# Patient Record
Sex: Female | Born: 2000 | Race: Asian | Hispanic: No | Marital: Single | Smoking: Never smoker
Health system: Southern US, Community
[De-identification: ages and names within clinical notes are randomized; demographics above are authoritative.]

---
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
## 2021-11-03 ENCOUNTER — Ambulatory Visit (HOSPITAL_COMMUNITY)
Admission: EM | Admit: 2021-11-03 | Discharge: 2021-11-03 | Disposition: A | Discharge status: Home / Self Care | Payer: PRIVATE HEALTH INSURANCE | Attending: Emergency Medicine | Admitting: Emergency Medicine

## 2021-11-03 ENCOUNTER — Other Ambulatory Visit: Payer: Self-pay

## 2021-11-03 ENCOUNTER — Encounter (HOSPITAL_COMMUNITY): Payer: Self-pay | Admitting: Emergency Medicine

## 2021-11-03 DIAGNOSIS — R220 Localized swelling, mass and lump, head: Secondary | ICD-10-CM | POA: Diagnosis not present

## 2021-11-03 DIAGNOSIS — L03211 Cellulitis of face: Secondary | ICD-10-CM

## 2021-11-03 DIAGNOSIS — T7840XA Allergy, unspecified, initial encounter: Secondary | ICD-10-CM

## 2021-11-03 MED ORDER — DIPHENHYDRAMINE HCL 50 MG/ML IJ SOLN
INTRAMUSCULAR | Status: AC
  Filled 2021-11-03: qty 1

## 2021-11-03 MED ORDER — METHYLPREDNISOLONE SODIUM SUCC 125 MG IJ SOLR
125.0000 mg | Freq: Once | INTRAMUSCULAR | Status: AC
  Administered 2021-11-03: 125 mg via INTRAMUSCULAR

## 2021-11-03 MED ORDER — METHYLPREDNISOLONE SODIUM SUCC 125 MG IJ SOLR
INTRAMUSCULAR | Status: AC
  Filled 2021-11-03: qty 2

## 2021-11-03 MED ORDER — DIPHENHYDRAMINE HCL 50 MG/ML IJ SOLN
25.0000 mg | Freq: Once | INTRAMUSCULAR | Status: AC
  Administered 2021-11-03: 25 mg via INTRAMUSCULAR

## 2021-11-03 NOTE — ED Provider Notes (Signed)
[facility]    CSN: [id number] Arrival date & time: 11/03/21  7673      History   Chief Complaint Chief Complaint  Patient presents with   Facial Swelling   Allergic Reaction    HPI Mikayla Mccall is a 20 y.o. female.   Pt had a chemical lash lift tent to upper and lower eyelids on mon 11/28. Pt began to have swelling, pain to eye area, thick drainage approx a few hours post treatment. Pt began to use cool compresses. Began to have a large amount of edema last night and today with erythema noted to surrounding tissue of bil eyes.  Pt is also beginning to have pain and pressure with vision change today.    History reviewed. No pertinent past medical history.  There are no problems to display for this patient.   History reviewed. No pertinent surgical history.  OB History   No obstetric history on file.      Home Medications    Prior to Admission medications   Medication Sig Start Date End Date Taking? Authorizing Provider  cetirizine (ZYRTEC) 10 MG tablet Take 1 tablet (10 mg total) by mouth daily. 12/27/19   Georgetta Haber, NP  mupirocin cream (BACTROBAN) 2 % Apply 1 application topically 2 (two) times daily. 12/27/19   Georgetta Haber, NP  predniSONE (STERAPRED UNI-PAK 21 TAB) 10 MG (21) TBPK tablet Take by mouth daily. Per box instruction 12/27/19   Georgetta Haber, NP    Family History Family History  Problem Relation Age of Onset   Healthy Father     Social History     Allergies   Patient has no known allergies.   Review of Systems Review of Systems  Constitutional:  Negative for fever.  HENT:  Positive for facial swelling.   Eyes:  Positive for photophobia, pain, discharge, redness, itching and visual disturbance.  Respiratory: Negative.  Negative for shortness of breath.   Cardiovascular: Negative.   Gastrointestinal: Negative.   Genitourinary: Negative.   Musculoskeletal: Negative.   Skin:        Large amount of swelling around bil  eyes with redness   Neurological:  Positive for headaches.    Physical Exam Triage Vital Signs ED Triage Vitals  Enc Vitals Group     BP 11/03/21 0920 117/82     Pulse Rate 11/03/21 0920 89     Resp 11/03/21 0920 16     Temp 11/03/21 0920 98.3 F (36.8 C)     Temp Source 11/03/21 0920 Oral     SpO2 11/03/21 0920 99 %     Weight --      Height --      Head Circumference --      Peak Flow --      Pain Score 11/03/21 0918 0     Pain Loc --      Pain Edu? --      Excl. in GC? --    No data found.  Updated Vital Signs BP 117/82 (BP Location: Right Arm)   Pulse 89   Temp 98.3 F (36.8 C) (Oral)   Resp 16   LMP 10/19/2021   SpO2 99%   Visual Acuity Right Eye Distance:   Left Eye Distance:   Bilateral Distance:    Right Eye Near:   Left Eye Near:    Bilateral Near:     Physical Exam Constitutional:      General: She is in acute distress.  HENT:     Right Ear: Tympanic membrane normal.     Left Ear: Tympanic membrane normal.     Nose: Nose normal.     Mouth/Throat:     Mouth: Mucous membranes are moist.  Eyes:     General: Visual field deficit present.        Right eye: Discharge present.        Left eye: Discharge present.    Extraocular Movements:     Right eye: Abnormal extraocular motion present.     Left eye: Abnormal extraocular motion present.     Conjunctiva/sclera:     Right eye: Exudate present.     Left eye: Exudate present.      Comments: Large amount of edema and erythema noted to bil conjunctiva and surrounding tissue pain on palpation. Photophobia  to lateral side of bil cornea .   Neurological:     Mental Status: She is alert.     UC Treatments / Results  Labs (all labs ordered are listed, but only abnormal results are displayed) Labs Reviewed - No data to display  EKG   Radiology No results found.  Procedures Procedures (including critical care time)  Medications Ordered in UC Medications  methylPREDNISolone sodium  succinate (SOLU-MEDROL) 125 mg/2 mL injection 125 mg (125 mg Intramuscular Given 11/03/21 1047)  diphenhydrAMINE (BENADRYL) injection 25 mg (25 mg Intramuscular Given 11/03/21 1047)    Initial Impression / Assessment and Plan / UC Course  I have reviewed the triage vital signs and the nursing notes.  Pertinent labs & imaging results that were available during my care of the patient were reviewed by me and considered in my medical decision making (see chart for details).     Pt was not able to tolerate a vision acuity c/o of it being blurry not able to see it well.  Mani PA seen pt with me  Reviewed previous chart in Jan pt had a similar contact derm reaction to brows.  Spoke to [employer] at [employer] and she was able to see pt at 1245 today. Educated pt and she has a friend and driver with pt. Both understand the plan of care. If pt has increase pain or shortness of breath she is to go to ER  Discussed the concerns for a chemical burn vs allergic reaction  Final Clinical Impressions(s) / UC Diagnoses   Final diagnoses:  Facial swelling  Allergic reaction, initial encounter  Facial cellulitis     Discharge Instructions      I have spoke to Doc Lyles office and scheduled you an appoint to be seen today at 1245. Bring any insurance information with you.  If you have any increase in pain or blurred vision before this time go to [place]  I am concerned of a chemical burn or cellulitis to the area.  Do not drive after given the medication it can make you sleepy.       ED Prescriptions   None    PDMP not reviewed this encounter.   Coralyn Mark, NP 11/03/21 1056

## 2021-11-03 NOTE — Discharge Instructions (Addendum)
I have spoke to Mikayla Mccall office and scheduled you an appoint to be seen today at 1245. Bring any insurance information with you.  If you have any increase in pain or blurred vision before this time go to   I am concerned of a chemical burn or cellulitis to the area.  Do not drive after given the medication it can make you sleepy.

## 2021-11-03 NOTE — ED Triage Notes (Signed)
Pt reports on Monday had eye lash lift and tent done. Yesterday started having swelling around eyes, drainage.

## 2022-03-13 ENCOUNTER — Ambulatory Visit (HOSPITAL_COMMUNITY)
Admission: EM | Admit: 2022-03-13 | Discharge: 2022-03-13 | Disposition: A | Discharge status: Home / Self Care | Payer: Medicaid Other | Attending: Nurse Practitioner | Admitting: Nurse Practitioner

## 2022-03-13 ENCOUNTER — Encounter (HOSPITAL_COMMUNITY): Payer: Self-pay

## 2022-03-13 DIAGNOSIS — T7840XA Allergy, unspecified, initial encounter: Secondary | ICD-10-CM | POA: Diagnosis not present

## 2022-03-13 DIAGNOSIS — M7989 Other specified soft tissue disorders: Secondary | ICD-10-CM | POA: Diagnosis not present

## 2022-03-13 MED ORDER — METHYLPREDNISOLONE SODIUM SUCC 125 MG IJ SOLR
80.0000 mg | Freq: Once | INTRAMUSCULAR | Status: AC
  Administered 2022-03-13: 80 mg via INTRAMUSCULAR

## 2022-03-13 MED ORDER — DIPHENHYDRAMINE HCL 25 MG PO TABS: 25.0000 mg | ORAL_TABLET | Freq: Every evening | ORAL | 0 refills | Status: AC | PRN

## 2022-03-13 MED ORDER — PREDNISONE 10 MG (21) PO TBPK: ORAL_TABLET | ORAL | 0 refills | Status: AC

## 2022-03-13 MED ORDER — DIPHENHYDRAMINE HCL 25 MG PO CAPS
25.0000 mg | ORAL_CAPSULE | Freq: Once | ORAL | Status: AC
  Administered 2022-03-13: 25 mg via ORAL

## 2022-03-13 MED ORDER — CETIRIZINE HCL 10 MG PO TABS: 10.0000 mg | ORAL_TABLET | Freq: Every day | ORAL | 0 refills | Status: DC

## 2022-03-13 MED ORDER — METHYLPREDNISOLONE SODIUM SUCC 125 MG IJ SOLR
INTRAMUSCULAR | Status: AC
  Filled 2022-03-13: qty 2

## 2022-03-13 MED ORDER — DIPHENHYDRAMINE HCL 25 MG PO CAPS
ORAL_CAPSULE | ORAL | Status: AC
  Filled 2022-03-13: qty 1

## 2022-03-13 NOTE — ED Triage Notes (Signed)
Pt presents with allergic reaction on left hand to henna tattoo X 3 days. ?

## 2022-03-13 NOTE — ED Provider Notes (Signed)
?[facility] ? ? ? ?CSN: [id number] ?Arrival date & time: 03/13/22  1308 ? ? ?  ? ?History   ?Chief Complaint ?Chief Complaint  ?Patient presents with  ? Allergic Reaction  ? ? ?HPI ?Mikayla Mccall is a 21 y.o. female.  ? ?Patient presents with mother.  Reports receiving a henna tattoo to her left hand by a friend a few days ago and noticed itching and burning since then to the area where the tattoo was.  She reports the left hand is swollen, painful, and itchy.  There are blisters on her left hand and have drained just a little bit.  She also has some itchy blisters to her face and right side of her neck.  She denies any fevers, nausea/vomiting, throat or tongue swelling or itching, and shortness of breath.  She is able to move her left hand with full range of motion and denies any numbness or tingling in her fingertips.  She has tried hydrocortisone cream over-the-counter without relief of symptoms. ? ? ?History reviewed. No pertinent past medical history. ? ?There are no problems to display for this patient. ? ? ?History reviewed. No pertinent surgical history. ? ?OB History   ?No obstetric history on file. ?  ? ? ? ?Home Medications   ? ?Prior to Admission medications   ?Medication Sig Start Date End Date Taking? Authorizing Provider  ?diphenhydrAMINE (BENADRYL) 25 MG tablet Take 1 tablet (25 mg total) by mouth at bedtime as needed for itching. 03/13/22  Yes Valentino Nose, NP  ?predniSONE (STERAPRED UNI-PAK 21 TAB) 10 MG (21) TBPK tablet Take 6 tablets (60 mg total) by mouth daily for 1 day, THEN 5 tablets (50 mg total) daily for 1 day, THEN 4 tablets (40 mg total) daily for 1 day, THEN 3 tablets (30 mg total) daily for 1 day, THEN 2 tablets (20 mg total) daily for 1 day, THEN 1 tablet (10 mg total) daily for 1 day. 03/13/22 03/19/22 Yes Valentino Nose, NP  ?cetirizine (ZYRTEC) 10 MG tablet Take 1 tablet (10 mg total) by mouth daily. 03/13/22   Valentino Nose, NP  ?mupirocin cream (BACTROBAN) 2 %  Apply 1 application topically 2 (two) times daily. 12/27/19   Georgetta Haber, NP  ? ? ?Family History ?Family History  ?Problem Relation Age of Onset  ? Healthy Father   ? ? ?Social History ?Social History  ? ?Tobacco Use  ? Smoking status: Never  ? Smokeless tobacco: Never  ? ? ? ?Allergies   ?Patient has no known allergies. ? ? ?Review of Systems ?Review of Systems ?Per HPI ? ?Physical Exam ?Triage Vital Signs ?ED Triage Vitals  ?Enc Vitals Group  ?   BP 03/13/22 1329 108/75  ?   Pulse Rate 03/13/22 1329 77  ?   Resp 03/13/22 1329 18  ?   Temp 03/13/22 1329 98.7 ?F (37.1 ?C)  ?   Temp Source 03/13/22 1329 Oral  ?   SpO2 03/13/22 1329 100 %  ?   Weight --   ?   Height --   ?   Head Circumference --   ?   Peak Flow --   ?   Pain Score 03/13/22 1332 3  ?   Pain Loc --   ?   Pain Edu? --   ?   Excl. in GC? --   ? ?No data found. ? ?Updated Vital Signs ?BP 108/75 (BP Location: Left Arm)   Pulse 77  Temp 98.7 ?F (37.1 ?C) (Oral)   Resp 18   LMP 03/08/2022   SpO2 100%  ? ?Visual Acuity ?Right Eye Distance:   ?Left Eye Distance:   ?Bilateral Distance:   ? ?Right Eye Near:   ?Left Eye Near:    ?Bilateral Near:    ? ?Physical Exam ?Vitals and nursing note reviewed.  ?Constitutional:   ?   General: She is not in acute distress. ?   Appearance: Normal appearance. She is not toxic-appearing.  ?HENT:  ?   Mouth/Throat:  ?   Mouth: Mucous membranes are moist.  ?   Pharynx: Oropharynx is clear. No oropharyngeal exudate.  ?Musculoskeletal:  ?   Left hand: Swelling present. Normal range of motion. Normal strength. Normal capillary refill. Normal pulse.  ?   Comments: Left dorsal aspect of hand is slightly erythematous and swollen surrounding the vesicles  ?Skin: ?   General: Skin is warm and dry.  ?   Capillary Refill: Capillary refill takes less than 2 seconds.  ?   Findings: Rash present. Rash is urticarial and vesicular.  ?   Comments: Vesicular rash to dorsal aspect of left hand including fourth and fifth fingers.  There  is no blistering medial to the wrist.  There are a couple of blisters on the right side of her face and on her neck.  ?Neurological:  ?   Mental Status: She is alert and oriented to person, place, and time.  ?Psychiatric:     ?   Behavior: Behavior is cooperative.  ? ? ? ?UC Treatments / Results  ?Labs ?(all labs ordered are listed, but only abnormal results are displayed) ?Labs Reviewed - No data to display ? ?EKG ? ? ?Radiology ?No results found. ? ?Procedures ?Procedures (including critical care time) ? ?Medications Ordered in UC ?Medications  ?methylPREDNISolone sodium succinate (SOLU-MEDROL) 125 mg/2 mL injection 80 mg (has no administration in time range)  ?diphenhydrAMINE (BENADRYL) capsule 25 mg (has no administration in time range)  ? ? ?Initial Impression / Assessment and Plan / UC Course  ?I have reviewed the triage vital signs and the nursing notes. ? ?Pertinent labs & imaging results that were available during my care of the patient were reviewed by me and considered in my medical decision making (see chart for details). ? ?  ?Suspect allergic reaction and possible chemical burn resulting in vesicular rash and swelling to left hand.  Treat with Solu-Medrol 80 mg IM and Benadryl 25 mg orally today in urgent care.  Mother will drive patient home.  Start prednisone taper tomorrow morning.  Start daily oral nondrowsy antihistamine in the morning and can use diphenhydramine 25 mg nightly to help with itching overnight.  Encouraged keeping the blisters intact as much as possible to prevent opportunity for secondary bacterial infection.  Encouraged cool compresses to the left hand. ?Final Clinical Impressions(s) / UC Diagnoses  ? ?Final diagnoses:  ?Allergic reaction, initial encounter  ?Swelling of left hand  ? ? ? ?Discharge Instructions   ? ?  ?We have given you a shot of steroid today to help with the swelling, itching, and inflammation from the allergic reaction; we have also given you Benadryl to help  with the itching.  This may make you sleepy; please make sure your mom drives the rest of the day and do not operate heavy machinery.   ? ?Start the prednisone taper tomorrow morning.  ? ?Start taking cetirizine 10 mg daily in the morning and you can also  take Benadryl 25 mg at night time to help with itching.   ? ?If your symptoms do not improve with the prescribed treatment, please seek care. ? ? ? ?ED Prescriptions   ? ? Medication Sig Dispense Auth. Provider  ? cetirizine (ZYRTEC) 10 MG tablet Take 1 tablet (10 mg total) by mouth daily. 30 tablet Cathlean MarseillesMartinez, Jerri Glauser A, NP  ? diphenhydrAMINE (BENADRYL) 25 MG tablet Take 1 tablet (25 mg total) by mouth at bedtime as needed for itching. 30 tablet Cathlean MarseillesMartinez, Marnita Poirier A, NP  ? predniSONE (STERAPRED UNI-PAK 21 TAB) 10 MG (21) TBPK tablet Take 6 tablets (60 mg total) by mouth daily for 1 day, THEN 5 tablets (50 mg total) daily for 1 day, THEN 4 tablets (40 mg total) daily for 1 day, THEN 3 tablets (30 mg total) daily for 1 day, THEN 2 tablets (20 mg total) daily for 1 day, THEN 1 tablet (10 mg total) daily for 1 day. 1 each Valentino NoseMartinez, Ronda Rajkumar A, NP  ? ?  ? ?PDMP not reviewed this encounter. ?  ?Valentino NoseMartinez, Kadin Bera A, NP ?03/13/22 1406 ? ?

## 2022-03-13 NOTE — Discharge Instructions (Addendum)
We have given you a shot of steroid today to help with the swelling, itching, and inflammation from the allergic reaction; we have also given you Benadryl to help with the itching.  This may make you sleepy; please make sure your mom drives the rest of the day and do not operate heavy machinery.   ? ?Start the prednisone taper tomorrow morning.  ? ?Start taking cetirizine 10 mg daily in the morning and you can also take Benadryl 25 mg at night time to help with itching.   ? ?If your symptoms do not improve with the prescribed treatment, please seek care. ?

## 2023-03-30 ENCOUNTER — Ambulatory Visit (HOSPITAL_COMMUNITY)
Admission: EM | Admit: 2023-03-30 | Discharge: 2023-03-30 | Disposition: A | Discharge status: Home / Self Care | Payer: Medicaid Other | Attending: Family Medicine | Admitting: Family Medicine

## 2023-03-30 ENCOUNTER — Encounter (HOSPITAL_COMMUNITY): Payer: Self-pay

## 2023-03-30 DIAGNOSIS — R220 Localized swelling, mass and lump, head: Secondary | ICD-10-CM

## 2023-03-30 DIAGNOSIS — R21 Rash and other nonspecific skin eruption: Secondary | ICD-10-CM

## 2023-03-30 MED ORDER — METHYLPREDNISOLONE 4 MG PO TBPK: ORAL_TABLET | ORAL | 0 refills | Status: AC

## 2023-03-30 MED ORDER — CETIRIZINE HCL 10 MG PO TABS: 10.0000 mg | ORAL_TABLET | Freq: Every day | ORAL | 0 refills | Status: AC

## 2023-03-30 NOTE — ED Triage Notes (Signed)
Pt reports facial rash for several months. Pt stated this rash comes and goes.

## 2023-03-30 NOTE — ED Provider Notes (Signed)
[facility]    CSN: [id number] Arrival date & time: 03/30/23  1135      History   Chief Complaint Chief Complaint  Patient presents with   Rash    HPI Mikayla Mccall is a 22 y.o. female.   Patient is here for a rash on her face. Has been there off/on for several months.  This rash restarted several weeks ago.  Stings, burns.  She has used creams and sunscreen that she normally uses.  No changes in soaps, lotions, detergents, etc.  Has not otherwise used anything for the rash.        History reviewed. No pertinent past medical history.  There are no problems to display for this patient.   History reviewed. No pertinent surgical history.  OB History   No obstetric history on file.      Home Medications    Prior to Admission medications   Medication Sig Start Date End Date Taking? Authorizing Provider  cetirizine (ZYRTEC) 10 MG tablet Take 1 tablet (10 mg total) by mouth daily. 03/13/22   Valentino Nose, NP  diphenhydrAMINE (BENADRYL) 25 MG tablet Take 1 tablet (25 mg total) by mouth at bedtime as needed for itching. 03/13/22   Valentino Nose, NP  mupirocin cream (BACTROBAN) 2 % Apply 1 application topically 2 (two) times daily. 12/27/19   Georgetta Haber, NP    Family History Family History  Problem Relation Age of Onset   Healthy Father     Social History Social History   Tobacco Use   Smoking status: Never   Smokeless tobacco: Never     Allergies   Patient has no known allergies.   Review of Systems Review of Systems  Constitutional: Negative.   HENT: Negative.    Respiratory: Negative.    Gastrointestinal: Negative.   Musculoskeletal: Negative.   Skin:  Positive for rash.  Psychiatric/Behavioral: Negative.       Physical Exam Triage Vital Signs ED Triage Vitals [03/30/23 1149]  Enc Vitals Group     BP 118/84     Pulse Rate 97     Resp 16     Temp 98.1 F (36.7 C)     Temp Source Oral     SpO2 97 %     Weight       Height      Head Circumference      Peak Flow      Pain Score      Pain Loc      Pain Edu?      Excl. in GC?    No data found.  Updated Vital Signs BP 118/84 (BP Location: Left Arm)   Pulse 97   Temp 98.1 F (36.7 C) (Oral)   Resp 16   SpO2 97%   Visual Acuity Right Eye Distance:   Left Eye Distance:   Bilateral Distance:    Right Eye Near:   Left Eye Near:    Bilateral Near:     Physical Exam Constitutional:      Appearance: Normal appearance.  Cardiovascular:     Rate and Rhythm: Normal rate.  Pulmonary:     Effort: Pulmonary effort is normal.  Skin:    Comments: She has redness and swelling under the eyes bilaterally and upper cheeks;  no distinct rash is noted;  eyes appear normal  Neurological:     General: No focal deficit present.     Mental Status: She is alert.  Psychiatric:  Mood and Affect: Mood normal.      UC Treatments / Results  Labs (all labs ordered are listed, but only abnormal results are displayed) Labs Reviewed - No data to display  EKG   Radiology No results found.  Procedures Procedures (including critical care time)  Medications Ordered in UC Medications - No data to display  Initial Impression / Assessment and Plan / UC Course  I have reviewed the triage vital signs and the nursing notes.  Pertinent labs & imaging results that were available during my care of the patient were reviewed by me and considered in my medical decision making (see chart for details).   Final Clinical Impressions(s) / UC Diagnoses   Final diagnoses:  Facial rash  Facial swelling     Discharge Instructions      You were seen today for facial rash/allergic reaction.  I have sent out a steroid pack and zyrtec to your pharmacy.  You may use over the counter cortisone cream once/day to the affected area as well.  As this has happened several times, I recommend you see an allergist for further testing.  You may try to call [facility] at [phone].  You may need a referral from a primary care provider.  You may go to [email or web address] to find one.     ED Prescriptions     Medication Sig Dispense Auth. Provider   cetirizine (ZYRTEC) 10 MG tablet Take 1 tablet (10 mg total) by mouth daily. 30 tablet Jessalyn Hinojosa, Denny Peon, MD   methylPREDNISolone (MEDROL DOSEPAK) 4 MG TBPK tablet Take as directed 1 each Jannifer Franklin, MD      PDMP not reviewed this encounter.   Jannifer Franklin, MD 03/30/23 1210

## 2023-03-30 NOTE — Discharge Instructions (Addendum)
You were seen today for facial rash/allergic reaction.  I have sent out a steroid pack and zyrtec to your pharmacy.  You may use over the counter cortisone cream once/day to the affected area as well.  As this has happened several times, I recommend you see an allergist for further testing.  You may try to call [facility] at [phone].  You may need a referral from a primary care provider.  You may go to [email or web address] to find one.

## 2024-01-15 ENCOUNTER — Ambulatory Visit (HOSPITAL_COMMUNITY)
Admission: EM | Admit: 2024-01-15 | Discharge: 2024-01-15 | Disposition: A | Discharge status: Home / Self Care | Payer: Medicaid Other | Attending: Internal Medicine | Admitting: Internal Medicine

## 2024-01-15 ENCOUNTER — Encounter (HOSPITAL_COMMUNITY): Payer: Self-pay

## 2024-01-15 DIAGNOSIS — J101 Influenza due to other identified influenza virus with other respiratory manifestations: Secondary | ICD-10-CM

## 2024-01-15 LAB — POC COVID19/FLU A&B COMBO
Covid Antigen, POC: NEGATIVE
Influenza A Antigen, POC: POSITIVE — AB
Influenza B Antigen, POC: NEGATIVE

## 2024-01-15 MED ORDER — ACETAMINOPHEN 325 MG PO TABS
ORAL_TABLET | ORAL | Status: AC
  Filled 2024-01-15: qty 2

## 2024-01-15 MED ORDER — OSELTAMIVIR PHOSPHATE 75 MG PO CAPS: 75.0000 mg | ORAL_CAPSULE | Freq: Two times a day (BID) | ORAL | 0 refills | Status: AC

## 2024-01-15 MED ORDER — PROMETHAZINE-DM 6.25-15 MG/5ML PO SYRP: 5.0000 mL | ORAL_SOLUTION | Freq: Three times a day (TID) | ORAL | 0 refills | Status: AC | PRN

## 2024-01-15 MED ORDER — ACETAMINOPHEN 325 MG PO TABS
650.0000 mg | ORAL_TABLET | Freq: Once | ORAL | Status: AC
  Administered 2024-01-15: 650 mg via ORAL

## 2024-01-15 NOTE — ED Triage Notes (Addendum)
 Pt c/o of cough, headache, body aches, runny nose, cramps, runny nose, sneezing, and chills.   Start Date: 01/14/2024  Home Interventions: robitussin

## 2024-01-15 NOTE — ED Provider Notes (Signed)
 [facility]    CSN: [id number] Arrival date & time: 01/15/24  1308      History   Chief Complaint Chief Complaint  Patient presents with   Cough   Generalized Body Aches    HPI Mikayla Mccall is a 23 y.o. female.   23 year old female who presents urgent care with complaints of fevers, body aches, chills, cough, headache, runny nose.  Her symptoms started early Sunday.  She has been using Robitussin and Vicks vapor rub but not improving her symptoms.  Her family member has had the symptoms initially.  She has not been using any fever reducing medication.  She is eating and drinking well.  She denies shortness of breath, chest pain, nausea, vomiting, although she has had some mild abdominal cramping.   Cough Associated symptoms: chills and fever   Associated symptoms: no chest pain, no ear pain, no rash, no shortness of breath and no sore throat     History reviewed. No pertinent past medical history.  There are no active problems to display for this patient.   History reviewed. No pertinent surgical history.  OB History   No obstetric history on file.      Home Medications    Prior to Admission medications   Medication Sig Start Date End Date Taking? Authorizing Provider  cetirizine  (ZYRTEC ) 10 MG tablet Take 1 tablet (10 mg total) by mouth daily. 03/30/23   Piontek, Cleveland Dales, MD  diphenhydrAMINE  (BENADRYL ) 25 MG tablet Take 1 tablet (25 mg total) by mouth at bedtime as needed for itching. 03/13/22   Wilhemena Harbour, NP  methylPREDNISolone  (MEDROL  DOSEPAK) 4 MG TBPK tablet Take as directed 03/30/23   Lesle Ras, MD  mupirocin  cream (BACTROBAN ) 2 % Apply 1 application topically 2 (two) times daily. 12/27/19   Bynum Cassis, NP    Family History Family History  Problem Relation Age of Onset   Healthy Father     Social History Social History   Tobacco Use   Smoking status: Never   Smokeless tobacco: Never  Substance Use Topics   Alcohol use: Never    Drug use: Never     Allergies   Patient has no known allergies.   Review of Systems Review of Systems  Constitutional:  Positive for chills and fever.  HENT:  Positive for congestion and sneezing. Negative for ear pain and sore throat.   Eyes:  Negative for pain and visual disturbance.  Respiratory:  Positive for cough. Negative for shortness of breath.   Cardiovascular:  Negative for chest pain and palpitations.  Gastrointestinal:  Positive for abdominal pain (Mild abdominal cramps). Negative for vomiting.  Genitourinary:  Negative for dysuria and hematuria.  Musculoskeletal:  Negative for arthralgias and back pain.       Generalized body aches  Skin:  Negative for color change and rash.  Neurological:  Negative for seizures and syncope.  All other systems reviewed and are negative.    Physical Exam Triage Vital Signs ED Triage Vitals  Encounter Vitals Group     BP 01/15/24 1546 127/87     Systolic BP Percentile --      Diastolic BP Percentile --      Pulse Rate 01/15/24 1546 72     Resp 01/15/24 1546 18     Temp 01/15/24 1546 98.4 F (36.9 C)     Temp Source 01/15/24 1546 Oral     SpO2 01/15/24 1546 98 %     Weight 01/15/24 1541 122  lb 9.6 oz (55.6 kg)     Height --      Head Circumference --      Peak Flow --      Pain Score 01/15/24 1544 5     Pain Loc --      Pain Education --      Exclude from Growth Chart --    No data found.  Updated Vital Signs BP 127/87 (BP Location: Right Arm)   Pulse 72   Temp 98.4 F (36.9 C) (Oral)   Resp 18   Wt 122 lb 9.6 oz (55.6 kg)   LMP 12/30/2023   SpO2 98%   Visual Acuity Right Eye Distance:   Left Eye Distance:   Bilateral Distance:    Right Eye Near:   Left Eye Near:    Bilateral Near:     Physical Exam Vitals and nursing note reviewed.  Constitutional:      General: She is not in acute distress.    Appearance: She is well-developed.  HENT:     Head: Normocephalic and atraumatic.     Right Ear:  Tympanic membrane normal.     Left Ear: Tympanic membrane normal.     Nose: Congestion present.     Mouth/Throat:     Pharynx: Posterior oropharyngeal erythema (Very mild) present.  Eyes:     Conjunctiva/sclera: Conjunctivae normal.  Cardiovascular:     Rate and Rhythm: Normal rate and regular rhythm.     Heart sounds: No murmur heard. Pulmonary:     Effort: Pulmonary effort is normal. No respiratory distress.     Breath sounds: Normal breath sounds.  Abdominal:     General: Bowel sounds are normal.     Palpations: Abdomen is soft.     Tenderness: There is no abdominal tenderness.  Musculoskeletal:        General: No swelling.     Cervical back: Neck supple.  Skin:    General: Skin is warm and dry.     Capillary Refill: Capillary refill takes less than 2 seconds.  Neurological:     General: No focal deficit present.     Mental Status: She is alert.  Psychiatric:        Mood and Affect: Mood normal.      UC Treatments / Results  Labs (all labs ordered are listed, but only abnormal results are displayed) Labs Reviewed  POC COVID19/FLU A&B COMBO    EKG   Radiology No results found.  Procedures Procedures (including critical care time)  Medications Ordered in UC Medications  acetaminophen  (TYLENOL ) tablet 650 mg (650 mg Oral Given 01/15/24 1601)    Initial Impression / Assessment and Plan / UC Course  I have reviewed the triage vital signs and the nursing notes.  Pertinent labs & imaging results that were available during my care of the patient were reviewed by me and considered in my medical decision making (see chart for details).     Influenza A   Flu A is positive. This is a virus which does not require antibiotics. This normally takes 5 to 10 days to completely resolve.  We can treat the symptoms with the following: Tamiflu  75 mg twice daily for 5 days. Discontinue if you develop worsening headache, nausea, vomiting or hallucinations Promethazine  DM 5  mL every 8 hours as needed for cough.  Use caution as this medication can cause drowsiness. Tylenol  alternating with ibuprofen for fevers, chills and bodyaches Rest and stay hydrated.  Drink  plenty of fluids even if you do not feel like taking in solid food Avoid returning to work or school until 24 hours without a fever without taking fever medication. Return to urgent care or PCP if symptoms worsen or fail to resolve.    Final Clinical Impressions(s) / UC Diagnoses   Final diagnoses:  None   Discharge Instructions   None    ED Prescriptions   None    PDMP not reviewed this encounter.   Kreg Pesa, [place] 01/15/24 [phone]

## 2024-01-15 NOTE — Discharge Instructions (Addendum)
 Flu A is positive. This is a virus which does not require antibiotics. This normally takes 5 to 10 days to completely resolve.  We can treat the symptoms with the following: Tamiflu  75 mg twice daily for 5 days. Discontinue if you develop worsening headache, nausea, vomiting or hallucinations Promethazine  DM 5 mL every 8 hours as needed for cough.  Use caution as this medication can cause drowsiness. Tylenol  alternating with ibuprofen for fevers, chills and bodyaches Rest and stay hydrated.  Drink plenty of fluids even if you do not feel like taking in solid food Avoid returning to work until 24 hours without a fever without taking fever medication. Return to urgent care or PCP if symptoms worsen or fail to resolve.
